# Patient Record
Sex: Female | Born: 1975 | Race: Black or African American | Hispanic: No | Marital: Single | Smoking: Never smoker
Health system: Southern US, Community
[De-identification: ages and names within clinical notes are randomized; demographics above are authoritative.]

## PROBLEM LIST (undated history)

## (undated) DIAGNOSIS — I10 Essential (primary) hypertension: Secondary | ICD-10-CM

## (undated) DIAGNOSIS — G43909 Migraine, unspecified, not intractable, without status migrainosus: Secondary | ICD-10-CM

## (undated) HISTORY — DX: Essential (primary) hypertension: I10

## (undated) HISTORY — PX: OTHER SURGICAL HISTORY: SHX169

---

## 2011-07-21 ENCOUNTER — Emergency Department (INDEPENDENT_AMBULATORY_CARE_PROVIDER_SITE_OTHER): Payer: Managed Care, Other (non HMO)

## 2011-07-21 ENCOUNTER — Encounter (HOSPITAL_BASED_OUTPATIENT_CLINIC_OR_DEPARTMENT_OTHER): Payer: Self-pay

## 2011-07-21 ENCOUNTER — Other Ambulatory Visit: Payer: Self-pay

## 2011-07-21 ENCOUNTER — Emergency Department (HOSPITAL_BASED_OUTPATIENT_CLINIC_OR_DEPARTMENT_OTHER)
Admission: EM | Admit: 2011-07-21 | Discharge: 2011-07-21 | Disposition: A | Discharge status: Home / Self Care | Payer: Managed Care, Other (non HMO) | Attending: Emergency Medicine | Admitting: Emergency Medicine

## 2011-07-21 DIAGNOSIS — R0789 Other chest pain: Secondary | ICD-10-CM

## 2011-07-21 DIAGNOSIS — E049 Nontoxic goiter, unspecified: Secondary | ICD-10-CM

## 2011-07-21 DIAGNOSIS — R799 Abnormal finding of blood chemistry, unspecified: Secondary | ICD-10-CM

## 2011-07-21 DIAGNOSIS — R079 Chest pain, unspecified: Secondary | ICD-10-CM | POA: Insufficient documentation

## 2011-07-21 DIAGNOSIS — R071 Chest pain on breathing: Secondary | ICD-10-CM | POA: Insufficient documentation

## 2011-07-21 DIAGNOSIS — R0602 Shortness of breath: Secondary | ICD-10-CM | POA: Insufficient documentation

## 2011-07-21 MED ORDER — METHOCARBAMOL 500 MG PO TABS: 500.0000 mg | ORAL_TABLET | Freq: Two times a day (BID) | ORAL | Status: AC

## 2011-07-21 MED ORDER — KETOROLAC TROMETHAMINE 30 MG/ML IJ SOLN
30.0000 mg | Freq: Once | INTRAMUSCULAR | Status: AC
  Administered 2011-07-21: 30 mg via INTRAVENOUS
  Filled 2011-07-21: qty 1

## 2011-07-21 MED ORDER — METHOCARBAMOL 500 MG PO TABS: 500.0000 mg | ORAL_TABLET | Freq: Two times a day (BID) | ORAL | Status: DC

## 2011-07-21 MED ORDER — NAPROXEN 500 MG PO TABS: 500.0000 mg | ORAL_TABLET | Freq: Two times a day (BID) | ORAL | Status: AC

## 2011-07-21 MED ORDER — IBUPROFEN 600 MG PO TABS
600.0000 mg | ORAL_TABLET | Freq: Four times a day (QID) | ORAL | Status: DC | PRN
Start: 2011-07-21 — End: 2011-07-21

## 2011-07-21 MED ORDER — IOHEXOL 350 MG/ML SOLN
80.0000 mL | Freq: Once | INTRAVENOUS | Status: AC | PRN
Start: 2011-07-21 — End: 2011-07-21
  Administered 2011-07-21: 80 mL via INTRAVENOUS

## 2011-07-21 NOTE — ED Notes (Signed)
Pain level now 5/10

## 2011-07-21 NOTE — ED Provider Notes (Signed)
History     CSN: [id number]  Arrival date & time 07/21/11  1559   First MD Initiated Contact with Patient 07/21/11 1618      Chief Complaint  Patient presents with  . Chest Pain  . Shortness of Breath    (Consider location/radiation/quality/duration/timing/severity/associated sxs/prior treatment) Patient is a 36 y.o. female presenting with chest pain and shortness of breath. The history is provided by the patient.  Chest Pain Primary symptoms include shortness of breath.    Shortness of Breath  Associated symptoms include chest pain and shortness of breath.   patient presents with pleuritic chest pain x24 hours. Pain described as sharp in nature localized to the left side of chest. Some cough noted which also makes her pain worse. No fever, vomiting, diarrhea. Patient use over-the-counter cold medications without relief. No prior history of this. Denies any leg pain or swelling at this time. Cough has been nonproductive. No sore throat ear pain. Pain is made better with rest. No associated diaphoresis some dyspnea  History reviewed. No pertinent past medical history.  Past Surgical History  Procedure Date  . Tumor removal from neck     No family history on file.  History  Substance Use Topics  . Smoking status: Never Smoker   . Smokeless tobacco: Not on file  . Alcohol Use: No    OB History    Grav Para Term Preterm Abortions TAB SAB Ect Mult Living                  Review of Systems  Respiratory: Positive for shortness of breath.   Cardiovascular: Positive for chest pain.  All other systems reviewed and are negative.    Allergies  Review of patient's allergies indicates no known allergies.  Home Medications   Current Outpatient Rx  Name Route Sig Dispense Refill  . ACETAMINOPHEN-GUAIFENESIN 500-200 MG/15ML PO LIQD Oral Take 30 mLs by mouth every 4 (four) hours as needed. For congestion    . IBUPROFEN 200 MG PO TABS Oral Take 800 mg by mouth 3 (three)  times daily as needed. For pain      BP 131/83  Pulse 97  Temp(Src) 99 F (37.2 C) (Oral)  Resp 18  Ht 5\' 10"  (1.778 m)  Wt 285 lb (129.275 kg)  BMI 40.89 kg/m2  SpO2 100%  LMP 07/21/2011  Physical Exam  Nursing note and vitals reviewed. Constitutional: She is oriented to person, place, and time. She appears well-developed and well-nourished.  Non-toxic appearance. No distress.  HENT:  Head: Normocephalic and atraumatic.  Eyes: Conjunctivae, EOM and lids are normal. Pupils are equal, round, and reactive to light.  Neck: Normal range of motion. Neck supple. No tracheal deviation present. No mass present.  Cardiovascular: Normal rate, regular rhythm and normal heart sounds.  Exam reveals no gallop.   No murmur heard. Pulmonary/Chest: Effort normal and breath sounds normal. No stridor. No respiratory distress. She has no decreased breath sounds. She has no wheezes. She has no rhonchi. She has no rales. She exhibits tenderness. She exhibits no crepitus and no deformity.    Abdominal: Soft. Normal appearance and bowel sounds are normal. She exhibits no distension. There is no tenderness. There is no rebound and no CVA tenderness.  Musculoskeletal: Normal range of motion. She exhibits no edema and no tenderness.  Neurological: She is alert and oriented to person, place, and time. She has normal strength. No cranial nerve deficit or sensory deficit. GCS eye subscore is 4. GCS  verbal subscore is 5. GCS motor subscore is 6.  Skin: Skin is warm and dry. No abrasion and no rash noted.  Psychiatric: She has a normal mood and affect. Her speech is normal and behavior is normal.    ED Course  Procedures (including critical care time)  Labs Reviewed - No data to display No results found.   No diagnosis found.    MDM   Date: 07/21/2011  Rate: 94  Rhythm: normal sinus rhythm  QRS Axis: normal  Intervals: normal  ST/T Wave abnormalities: normal  Conduction Disutrbances:none   Narrative Interpretation:   Old EKG Reviewed: none available and unchanged    7:34 PM Patient given Toradol and feels better after this. CT of chest without evidence of pulmonary embolism. Patient to be discharged home      Toy Baker, MD 07/21/11 1935

## 2011-07-21 NOTE — ED Notes (Signed)
Pt reports onset of midsternal chest pain and SHOB that started yesterday.

## 2011-10-05 ENCOUNTER — Emergency Department (HOSPITAL_BASED_OUTPATIENT_CLINIC_OR_DEPARTMENT_OTHER)
Admission: EM | Admit: 2011-10-05 | Discharge: 2011-10-05 | Disposition: A | Discharge status: Home / Self Care | Payer: Managed Care, Other (non HMO) | Attending: Emergency Medicine | Admitting: Emergency Medicine

## 2011-10-05 ENCOUNTER — Emergency Department (INDEPENDENT_AMBULATORY_CARE_PROVIDER_SITE_OTHER): Payer: Managed Care, Other (non HMO)

## 2011-10-05 DIAGNOSIS — M542 Cervicalgia: Secondary | ICD-10-CM

## 2011-10-05 DIAGNOSIS — R209 Unspecified disturbances of skin sensation: Secondary | ICD-10-CM | POA: Insufficient documentation

## 2011-10-05 DIAGNOSIS — M7918 Myalgia, other site: Secondary | ICD-10-CM

## 2011-10-05 DIAGNOSIS — IMO0001 Reserved for inherently not codable concepts without codable children: Secondary | ICD-10-CM | POA: Insufficient documentation

## 2011-10-05 DIAGNOSIS — M79609 Pain in unspecified limb: Secondary | ICD-10-CM | POA: Insufficient documentation

## 2011-10-05 DIAGNOSIS — M25519 Pain in unspecified shoulder: Secondary | ICD-10-CM | POA: Insufficient documentation

## 2011-10-05 MED ORDER — CYCLOBENZAPRINE HCL 10 MG PO TABS
10.0000 mg | ORAL_TABLET | Freq: Three times a day (TID) | ORAL | Status: AC | PRN
Start: 2011-10-05 — End: 2011-10-15

## 2011-10-05 MED ORDER — PREDNISONE 20 MG PO TABS: ORAL_TABLET | ORAL | Status: DC

## 2011-10-05 MED ORDER — KETOROLAC TROMETHAMINE 60 MG/2ML IM SOLN
60.0000 mg | Freq: Once | INTRAMUSCULAR | Status: AC
  Administered 2011-10-05: 60 mg via INTRAMUSCULAR
  Filled 2011-10-05: qty 2

## 2011-10-05 NOTE — ED Provider Notes (Signed)
History     CSN: [id number]  Arrival date & time 10/05/11  0005   First MD Initiated Contact with Patient 10/05/11 0130      Chief Complaint  Patient presents with  . Shoulder Pain    started a month ago.  Pt. reports seeing and ortho at [facility] with pain meds and antiinflamatories given to her.      (Consider location/radiation/quality/duration/timing/severity/associated sxs/prior treatment) HPI  Patient relates she's had pain in her left neck and left shoulder that radiates into her left arm for the past month. She denies any known injury. She states she is right-handed. She states she has pain and sometimes she gets numbness on the dorsum of her left forearm. She states she is to start physical therapy on April 10. She states she was seen 2 or 3 weeks ago and was told she had inflammation and was started on listed medications. She relates her symptoms have not improved. She has noticed that movement of her head especially to the right makes the pain in her neck feel worse. Range of motion of her shoulder does not affect the pain in her shoulder.  PCP Dr. [facility]   No past medical history on file.  Past Surgical History  Procedure Date  . Tumor removal from neck     No family history on file.  History  Substance Use Topics  . Smoking status: Never Smoker   . Smokeless tobacco: Not on file  . 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    Allergies  Review of patient's allergies indicates no known allergies.  Home Medications   Current Outpatient Rx  Name Route Sig Dispense Refill  . CELECOXIB 200 MG PO CAPS Oral Take 200 mg by mouth 2 (two) times daily.    Marland Kitchen HYDROCODONE-ACETAMINOPHEN 5-500 MG PO TABS Oral Take 1 tablet by mouth every 6 (six) hours as needed.    .  METHOCARBAMOL 750 MG PO TABS Oral Take 750 mg by mouth 4 (four) times daily.    . ACETAMINOPHEN-GUAIFENESIN 500-200 MG/15ML PO LIQD Oral Take 30 mLs by mouth every 4 (four) hours as needed. For congestion    . IBUPROFEN 200 MG PO TABS Oral Take 800 mg by mouth 3 (three) times daily as needed. For pain    . NAPROXEN 500 MG PO TABS Oral Take 1 tablet (500 mg total) by mouth 2 (two) times daily. 30 tablet 0    BP 139/97  Pulse 103  Temp(Src) 98.1 F (36.7 C) (Oral)  Resp 18  Ht 5\' 11"  (1.803 m)  Wt 300 lb (136.079 kg)  BMI 41.84 kg/m2  SpO2 100%  LMP 09/18/2011  Vital signs normal tachycardia   Physical Exam  Nursing note and vitals reviewed. Constitutional: She is oriented to person, place, and time. She appears well-developed and well-nourished.  Non-toxic appearance. She does not appear ill. No distress.       Obese  HENT:  Head: Normocephalic and atraumatic.  Right Ear: External ear normal.  Left Ear: External ear normal.  Nose: Nose normal. No mucosal edema or rhinorrhea.  Mouth/Throat: Oropharynx is clear and moist and mucous membranes are normal. No dental abscesses or uvula swelling.  Eyes:  Conjunctivae and EOM are normal. Pupils are equal, round, and reactive to light.  Neck: Normal range of motion and full passive range of motion without pain. Neck supple.       Patient is tender in her left paraspinous muscles and also along her left trapezius which reproduces a lot of her pain.  Cardiovascular: Normal rate, regular rhythm and normal heart sounds.  Exam reveals no gallop and no friction rub.   No murmur heard. Pulmonary/Chest: Effort normal and breath sounds normal. No respiratory distress. She has no wheezes. She has no rhonchi. She has no rales. She exhibits no tenderness and no crepitus.  Abdominal: Soft. Normal appearance and bowel sounds are normal. She exhibits no distension. There is no tenderness. There is no rebound and no guarding.  Musculoskeletal: Normal range  of motion. She exhibits no edema and no tenderness.       Moves all extremities well. She does not have pain when I do range of motion of her shoulder specifically no pain consistent with rotator cuff injury. She has good distal grip.  Lymphadenopathy:    She has no cervical adenopathy.  Neurological: She is alert and oriented to person, place, and time. She has normal strength. No cranial nerve deficit.  Skin: Skin is warm, dry and intact. No rash noted. No erythema. No pallor.  Psychiatric: She has a normal mood and affect. Her speech is normal and behavior is normal. Her mood appears not anxious.    ED Course  Procedures (including critical care time)   Medications  ketorolac (TORADOL) injection 60 mg (60 mg Intramuscular Given 10/05/11 0150)   Pt is driving herself home.   Labs Reviewed - No data to display Dg Cervical Spine Complete  10/05/2011  *RADIOLOGY REPORT*  Clinical Data: Left-sided cervical spine pain for 1 month.  No known injury.  Left upper extremity numbness and tingling.  CERVICAL SPINE - COMPLETE 4+ VIEW  Comparison: None.  Findings: There is reversal of the usual cervical lordosis which may be due to patient positioning but ligamentous injury or muscle spasm can also have this appearance.  No significant displacement of the facet joints.  No bony encroachment upon the neural foramina.  No vertebral compression deformities.  Intervertebral disc space heights are preserved.  No prevertebral soft tissue swelling.  Lateral masses of C1 appear symmetrical.  The odontoid process appears intact.  IMPRESSION: Reversal of the usual cervical lordosis which may be due to patient positioning, ligamentous injury, or muscle spasm.  No displaced fractures identified.  Original Report Authenticated By: Marlon Pel, M.D.     1. Musculoskeletal pain    New Prescriptions   CYCLOBENZAPRINE (FLEXERIL) 10 MG TABLET    Take 1 tablet (10 mg total) by mouth 3 (three) times daily as needed  for muscle spasms.   PREDNISONE (DELTASONE) 20 MG TABLET    Take 3 po QD x 3d , then 2 po QD x 3d then 1 po QD x 3d   Plan discharge Devoria Albe, MD, FACEP    MDM          Ward Givens, MD 10/05/11 [phone]

## 2011-10-05 NOTE — ED Notes (Signed)
Pt. Reports at times she has numbness and tingling in the L arm.  TOnight Pt. Reports pain in L shoulder and L arm

## 2011-10-05 NOTE — ED Notes (Signed)
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 Reports seeing dr. Katrinka Blazing her pcp and md at [facility] couple weeks ago, neither could define cause or pinpoint issue. Per patient xrays at that time were negative. Ice packs, heat , pain patches and pain medications (vicodin and robaxin) have not helped. Last dosage was 1830 [place], she took both medications with no relief,  Not sleeping at home because of pain

## 2011-10-05 NOTE — Discharge Instructions (Signed)
Try ice packs on your sore muscles in the left side of your neck and shoulder. Stop the Celebrex, Advil, Naprosyn and robaxin. Take the prednisone and Flexeril as prescribed. Keep your appointment to start physical therapy on the 10th. Please call [facility] back to let them know the medications you are on is not helping.

## 2013-03-07 IMAGING — CT CT ANGIO CHEST
2 of 7 series · 19 of 36 positions shown · IV contrast (APPLIED)
Comparison: None.

CLINICAL DATA: Chest pain and shortness of breath with elevated D-
dimer.

CT ANGIOGRAPHY CHEST
TECHNIQUE: Multidetector CT imaging of the chest using the
standard protocol during bolus administration of intravenous
contrast. Multiplanar reconstructed images including MIPs were
obtained and reviewed to evaluate the vascular anatomy.
Contrast: 80mL OMNIPAQUE IOHEXOL 350 MG/ML IV SOLN

[Series 5: pe 1.0 b25f · axial · 0.58mm/px · z∈[-234,-46]mm · 18 of 209 slices shown]
[im 11/209  lung]
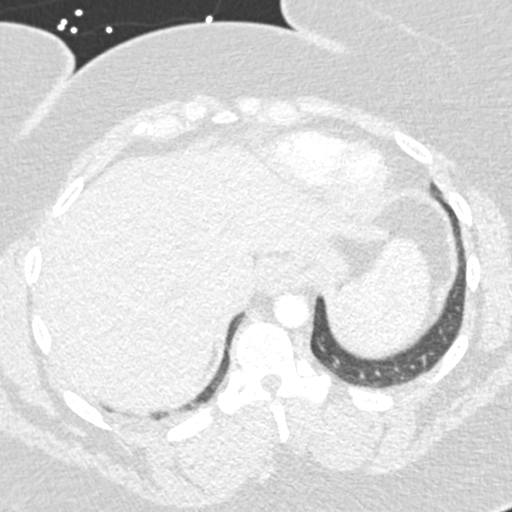
[im 21/209  mediastinal]
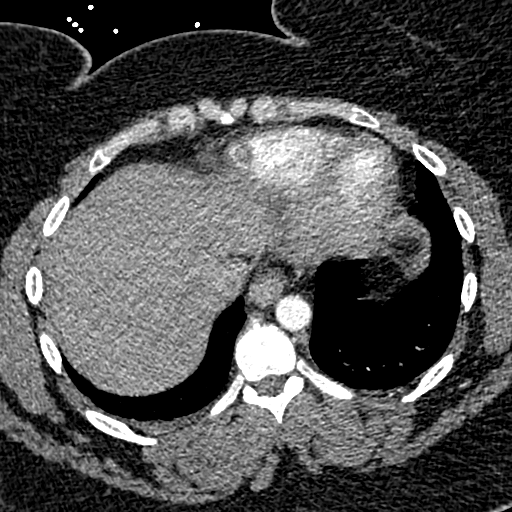
[im 32/209  lung]
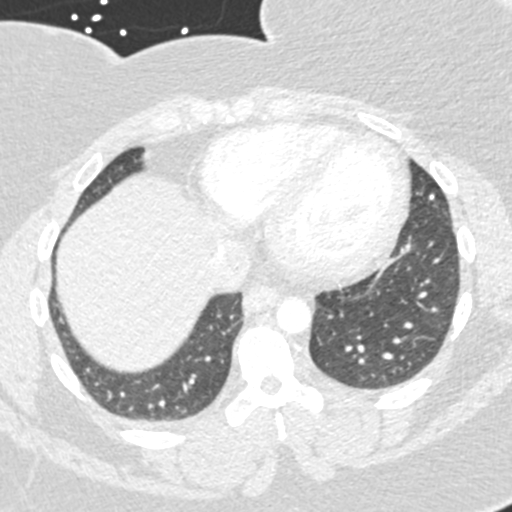
[im 42/209  mediastinal]
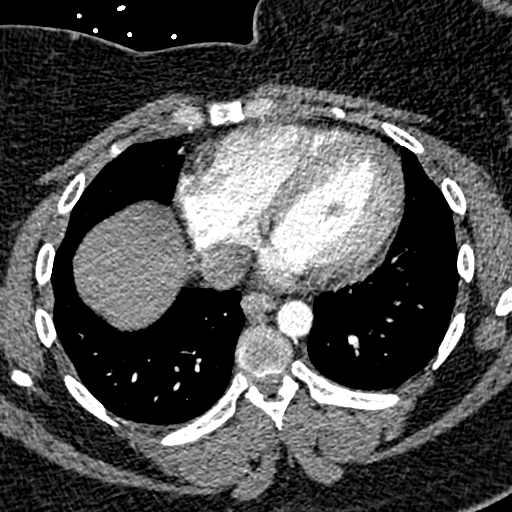
[im 53/209  lung]
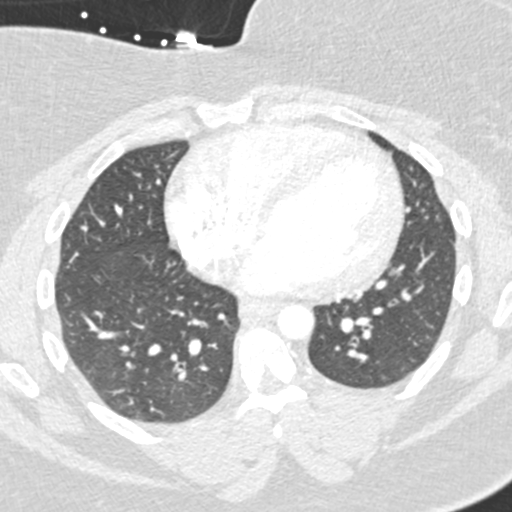
[im 63/209  mediastinal]
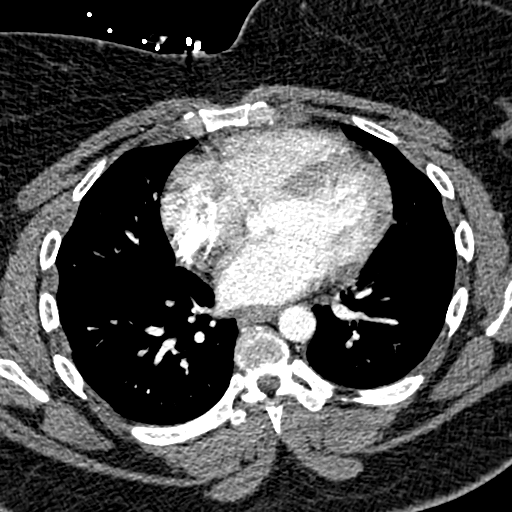
[im 73/209  lung]
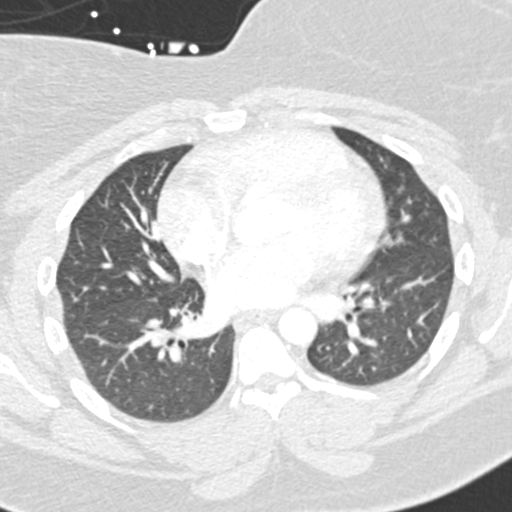
[im 84/209  mediastinal]
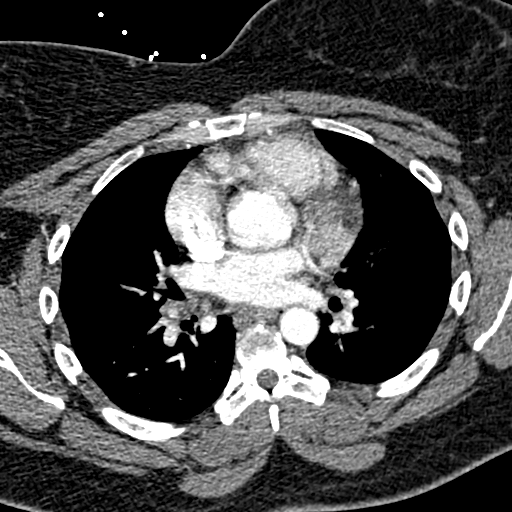
[im 94/209  lung]
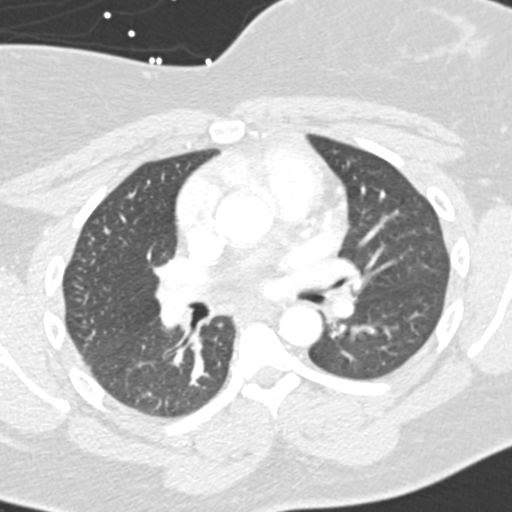
[im 115/209  mediastinal]
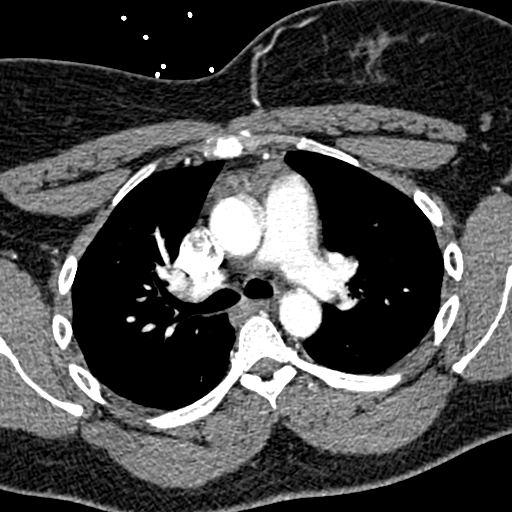
[im 125/209  lung]
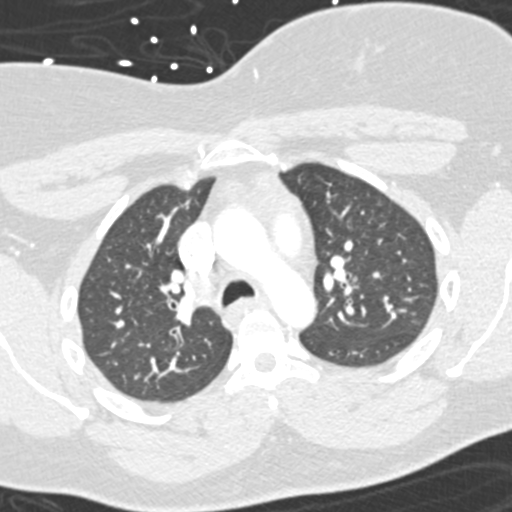
[im 136/209  mediastinal]
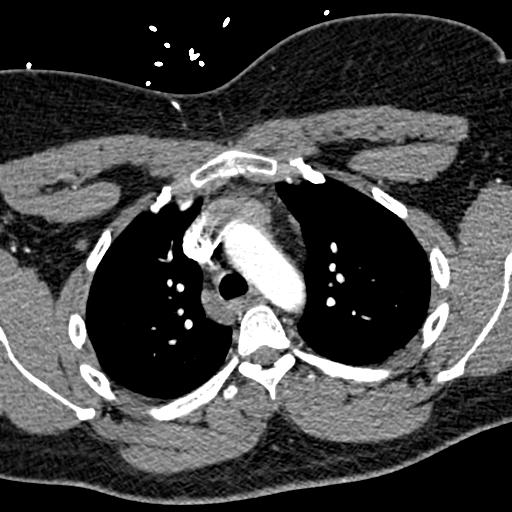
[im 146/209  lung]
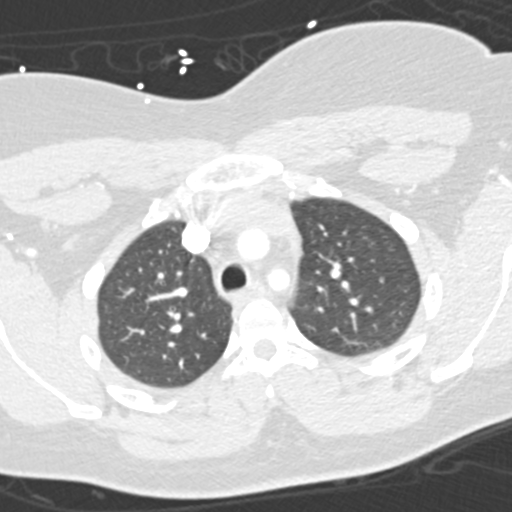
[im 157/209  mediastinal]
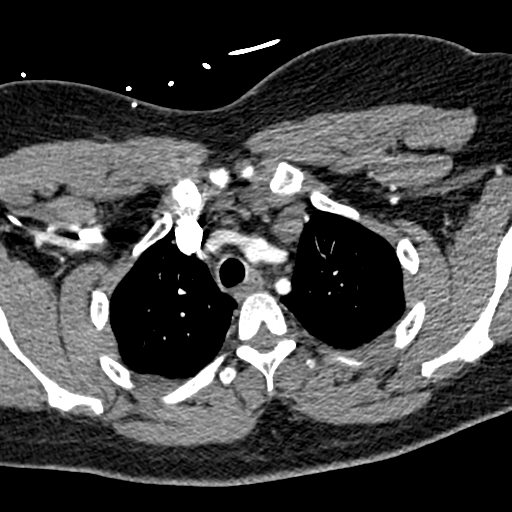
[im 167/209  lung]
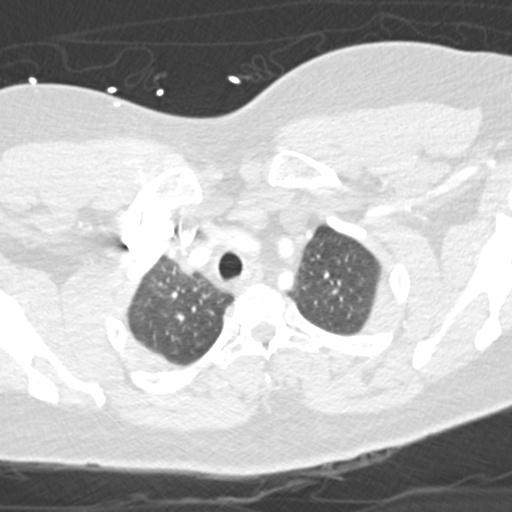
[im 177/209  mediastinal]
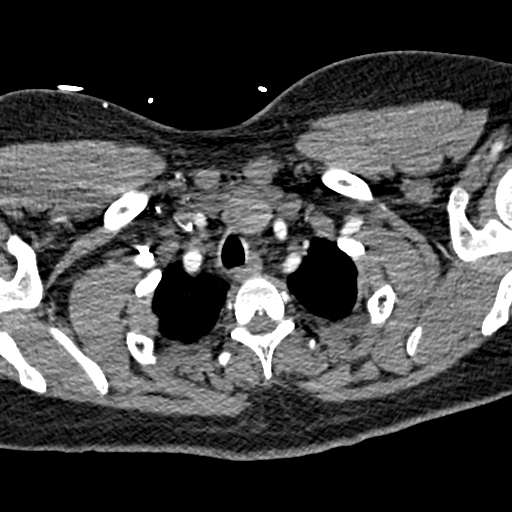
[im 188/209  lung]
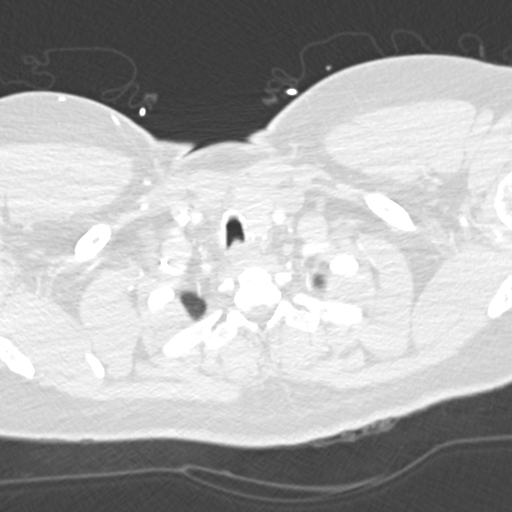
[im 198/209  mediastinal]
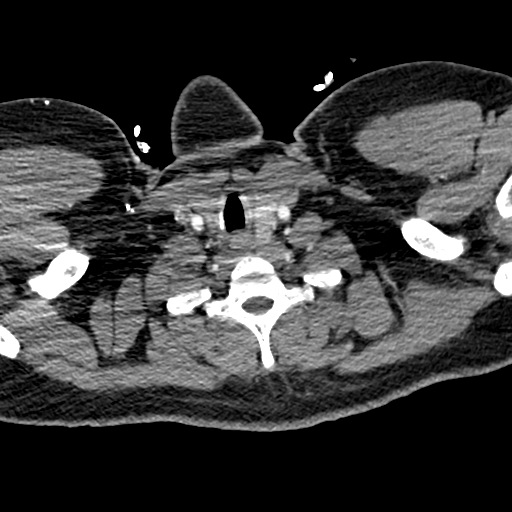

[Series 8: pe 2.0 coronal · coronal · 0.43mm/px · 1 of 107 slices shown]
[im 54/107  mediastinal]
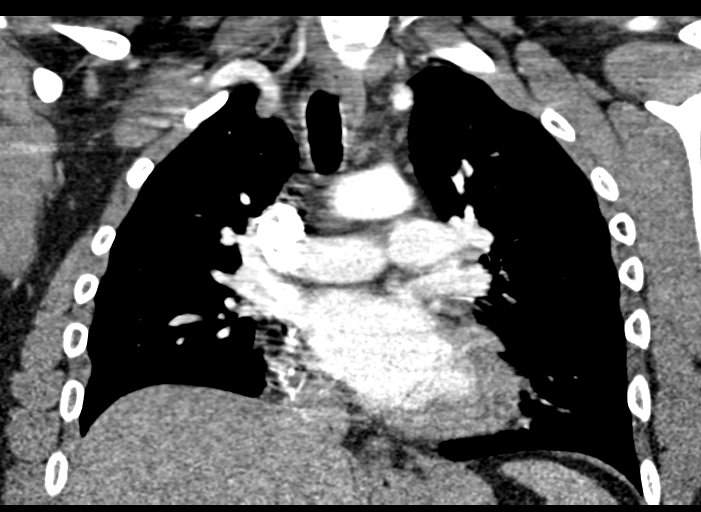

[19 of 36 positions shown; findings below may reference images not displayed]

FINDINGS: No pulmonary embolus.  Right lobe of the thyroid appears
surgically absent.  There appears to be nodular enlargement of the
left lobe of the thyroid.  Possible residual thymus in the
prevascular space.  Heart is enlarged.  No pericardial effusion.

Lungs are clear.  No pleural fluid.  Airway is unremarkable.

Incidental imaging of the upper abdomen shows a small retrocrural
lymph node.  No worrisome lytic or sclerotic lesions.
IMPRESSION: 1.  No pulmonary embolus.  No findings to explain the patient's
given symptoms.
2.  Nodular-appearing enlargement of the left lobe of the thyroid.
Please correlate clinically.

## 2015-06-28 ENCOUNTER — Encounter (HOSPITAL_BASED_OUTPATIENT_CLINIC_OR_DEPARTMENT_OTHER): Payer: Self-pay | Admitting: *Deleted

## 2015-06-28 ENCOUNTER — Emergency Department (HOSPITAL_BASED_OUTPATIENT_CLINIC_OR_DEPARTMENT_OTHER)
Admission: EM | Admit: 2015-06-28 | Discharge: 2015-06-28 | Disposition: A | Discharge status: Home / Self Care | Payer: BLUE CROSS/BLUE SHIELD | Attending: Emergency Medicine | Admitting: Emergency Medicine

## 2015-06-28 DIAGNOSIS — Z79899 Other long term (current) drug therapy: Secondary | ICD-10-CM | POA: Diagnosis not present

## 2015-06-28 DIAGNOSIS — E669 Obesity, unspecified: Secondary | ICD-10-CM | POA: Diagnosis not present

## 2015-06-28 DIAGNOSIS — Z791 Long term (current) use of non-steroidal anti-inflammatories (NSAID): Secondary | ICD-10-CM | POA: Diagnosis not present

## 2015-06-28 DIAGNOSIS — G43009 Migraine without aura, not intractable, without status migrainosus: Secondary | ICD-10-CM | POA: Diagnosis not present

## 2015-06-28 DIAGNOSIS — G43909 Migraine, unspecified, not intractable, without status migrainosus: Secondary | ICD-10-CM | POA: Diagnosis present

## 2015-06-28 HISTORY — DX: Migraine, unspecified, not intractable, without status migrainosus: G43.909

## 2015-06-28 MED ORDER — PROMETHAZINE HCL 25 MG/ML IJ SOLN
25.0000 mg | Freq: Once | INTRAMUSCULAR | Status: AC
  Administered 2015-06-28: 25 mg via INTRAMUSCULAR
  Filled 2015-06-28: qty 1

## 2015-06-28 MED ORDER — KETOROLAC TROMETHAMINE 60 MG/2ML IM SOLN
60.0000 mg | Freq: Once | INTRAMUSCULAR | Status: AC
  Administered 2015-06-28: 60 mg via INTRAMUSCULAR
  Filled 2015-06-28: qty 2

## 2015-06-28 MED ORDER — PROCHLORPERAZINE EDISYLATE 5 MG/ML IJ SOLN
10.0000 mg | Freq: Once | INTRAMUSCULAR | Status: AC | PRN
  Administered 2015-06-28: 10 mg via INTRAVENOUS
  Filled 2015-06-28: qty 2

## 2015-06-28 MED ORDER — SODIUM CHLORIDE 0.9 % IV BOLUS (SEPSIS)
1000.0000 mL | Freq: Once | INTRAVENOUS | Status: AC
  Administered 2015-06-28: 1000 mL via INTRAVENOUS

## 2015-06-28 MED ORDER — DIPHENHYDRAMINE HCL 50 MG/ML IJ SOLN
25.0000 mg | Freq: Once | INTRAMUSCULAR | Status: AC
Start: 2015-06-28 — End: 2015-06-28
  Administered 2015-06-28: 25 mg via INTRAVENOUS
  Filled 2015-06-28: qty 1

## 2015-06-28 NOTE — ED Provider Notes (Signed)
CSN: [id number]     Arrival date & time 06/28/15  1027 History   First MD Initiated Contact with Patient 06/28/15 [phone]     Chief Complaint  Patient presents with  . Migraine     (Consider location/radiation/quality/duration/timing/severity/associated sxs/prior Treatment) HPI  This is a 39 year old female with a history of migraines who presents with headache. Onset of headache on Friday. She reports it is throbbing. Currently 10 out of 10. She has taken her home Topamax and Relpax but has had no relief. Reports nausea without vomiting. Reports light sensitivity. It is located on the right side and is typical for her migraines. Denies worst headache of her life. Frequently has to get urgent care for treatment. Usually gets Toradol and Phenergan. Denies any fevers or neck pain.  Past Medical History  Diagnosis Date  . Migraines    Past Surgical History  Procedure Laterality Date  . Tumor removal from neck     No family history on file. Social History  Substance Use Topics  . Smoking status: Never Smoker   . Smokeless tobacco: None  . Alcohol Use: Yes     Comment: occasional    OB History    No data available     Review of Systems  Constitutional: Negative for fever.  Eyes: Positive for photophobia.  Respiratory: Negative for chest tightness and shortness of breath.   Cardiovascular: Negative for chest pain.  Gastrointestinal: Positive for nausea. Negative for vomiting.  Genitourinary: Negative for dysuria.  Musculoskeletal: Negative for neck pain and neck stiffness.  Neurological: Positive for headaches.  All other systems reviewed and are negative.     Allergies  Codeine  Home Medications   Prior to Admission medications   Medication Sig Start Date End Date Taking? Authorizing Provider  eletriptan (RELPAX) 40 MG tablet Take 40 mg by mouth as needed for migraine or headache. May repeat in 2 hours if headache persists or recurs.   Yes Historical Provider, MD   topiramate (TOPAMAX) 50 MG tablet Take 50 mg by mouth 2 (two) times daily.   Yes Historical Provider, MD  Acetaminophen-Guaifenesin (TYLENOL CHEST CONGESTION) 500-200 MG/15ML LIQD Take 30 mLs by mouth every 4 (four) hours as needed. For congestion    Historical Provider, MD  celecoxib (CELEBREX) 200 MG capsule Take 200 mg by mouth 2 (two) times daily.    Historical Provider, MD  HYDROcodone-acetaminophen (VICODIN) 5-500 MG per tablet Take 1 tablet by mouth every 6 (six) hours as needed.    Historical Provider, MD  ibuprofen (ADVIL,MOTRIN) 200 MG tablet Take 800 mg by mouth 3 (three) times daily as needed. For pain    Historical Provider, MD  methocarbamol (ROBAXIN) 750 MG tablet Take 750 mg by mouth 4 (four) times daily.    Historical Provider, MD  predniSONE (DELTASONE) 20 MG tablet Take 3 po QD x 3d , then 2 po QD x 3d then 1 po QD x 3d 10/05/11   Devoria Albe, MD   BP 116/84 mmHg  Pulse 84  Temp(Src) 98.2 F (36.8 C) (Oral)  Resp 20  Ht  (1.778 m)  Wt 271 lb 3 oz (123.01 kg)  BMI 38.91 kg/m2  SpO2 98%  LMP 06/02/2015 (Approximate) Physical Exam  Constitutional: She is oriented to person, place, and time. She appears well-developed and well-nourished.  Obese  HENT:  Head: Normocephalic and atraumatic.  Eyes: Pupils are equal, round, and reactive to light.  Neck: Normal range of motion.  Cardiovascular: Normal rate and regular  rhythm.   Pulmonary/Chest: Effort normal. No respiratory distress.  Musculoskeletal: She exhibits no edema.  Neurological: She is alert and oriented to person, place, and time.  Cranial nerves II-12 intact, fluent speech, normal gait  Skin: Skin is warm and dry.  Psychiatric: She has a normal mood and affect.  Nursing note and vitals reviewed.   ED Course  Procedures (including critical care time) Labs Review Labs Reviewed - No data to display  Imaging Review No results found. I have personally reviewed and evaluated these images and lab results as  part of my medical decision-making.   EKG Interpretation None      MDM   Final diagnoses:  Migraine without aura and without status migrainosus, not intractable    Patient presents with headache consistent with her migraines. Nontoxic on exam. Low suspicion for other causes of headache including subarachnoid hemorrhage or meningitis. Patient initially given Toradol and Phenergan. On recheck, reports persistent headache. IV was established and patient was given Compazine, Benadryl, and fluids.  6:20 AM On recheck, patient is more comfortable. She is requesting discharge to go sleep in her bed.  After history, exam, and medical workup I feel the patient has been appropriately medically screened and is safe for discharge home. Pertinent diagnoses were discussed with the patient. Patient was given return precautions.   Shon Batonourtney F Laryssa Hassing, MD 06/28/15 [phone]

## 2015-06-28 NOTE — ED Notes (Signed)
Pt. States hx of migraines and states she has had a h/a since Friday. States pain is located on the right side. States when she lays her head back her entire head will hurt. C/o nausea. Denies vomiting. C/o light sensitive. States this is typical of her migraines.

## 2015-06-28 NOTE — Discharge Instructions (Signed)

## 2015-06-28 NOTE — ED Notes (Signed)
MD with pt  

## 2015-09-04 ENCOUNTER — Encounter: Payer: Self-pay | Admitting: Neurology

## 2015-09-04 ENCOUNTER — Ambulatory Visit (INDEPENDENT_AMBULATORY_CARE_PROVIDER_SITE_OTHER): Payer: BLUE CROSS/BLUE SHIELD | Admitting: Neurology

## 2015-09-04 VITALS — BP 128/84 | HR 80 | Ht 70.0 in | Wt 277.1 lb

## 2015-09-04 DIAGNOSIS — N943 Premenstrual tension syndrome: Secondary | ICD-10-CM | POA: Diagnosis not present

## 2015-09-04 DIAGNOSIS — G43829 Menstrual migraine, not intractable, without status migrainosus: Secondary | ICD-10-CM | POA: Diagnosis not present

## 2015-09-04 MED ORDER — PROPRANOLOL HCL ER 60 MG PO CP24: 60.0000 mg | ORAL_CAPSULE | Freq: Every day | ORAL | Status: AC

## 2015-09-04 MED ORDER — ZOLMITRIPTAN 5 MG NA SOLN: 1.0000 | NASAL | Status: AC | PRN

## 2015-09-04 NOTE — Addendum Note (Signed)
Addended by: Vivien RotaRIVER, Iokepa Geffre H on: 8/2/95623/09/2015 03:41 PM   Modules accepted: Orders

## 2015-09-04 NOTE — Progress Notes (Signed)
NEUROLOGY CONSULTATION NOTE  Jenna Garcia MRN: [id number] DOB: 1975/09/27  Referring provider: Dr. Katrinka BlazingSmith Primary care provider: Dr. Katrinka BlazingSmith  Reason for consult:  migraine  HISTORY OF PRESENT ILLNESS: Jenna Garcia is a 40 year old right-handed female hyperlipidemia, pre-diabetes, GERD who presents for migraine.  History obtained by patient, PCP note and her boyfriend.  Onset:  2000 Location:  Unilateral, either side Quality:  pounding Intensity:  10/10 Aura:  Zigzag lines and spots Prodrome:  no Associated symptoms:  Photophobia, phonophobia, osmophobia, sometimes nausea and vomiting Duration:  1 day Frequency:  Occurs about 15 days per month during 3 week period from one week prior to period up to end of week after her period Triggers/exacerbating factors:  Period, stress Relieving factors:  sleep Activity:  Cannot function 5 days per month  CT of head approximately 2 years ago was reportedly unremarkable.  Past NSAIDS:  Advil, Aleve Past analgesics:  Tylenol Past abortive triptans:  Sumatriptan po Past antihypertensive medications:  none Past antidepressant medications:  none Past anticonvulsant medications:  none Past vitamins/Herbal/Supplements:  Magnesium oxide 250mg  (for Mg replacement)  Current NSAIDS:  Excedrin Migraine Current analgesics:  none Current triptans:  Relpax 40mg  Current anti-nausea:  none Current muscle relaxants:  none Current Antihypertensive medications:  none Current Antidepressant medications:  none Current Anticonvulsant medications:  topiramate 50mg  twice daily (higher doses caused side effects) Current Vitamins/Herbal/Supplements:  vitamin D Current Antihistamines/Decongestants:  none  Caffeine:  Green tea Alcohol:  occasionally Smoker:  no Diet:  Improving.  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 Migraines     PAST SURGICAL HISTORY: Past Surgical History  Procedure Laterality Date  . 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 eletriptan (RELPAX) 40 MG tablet Take 40 mg by mouth as needed for migraine or headache. May repeat in 2 hours if headache persists or recurs.    . topiramate (TOPAMAX) 50 MG tablet Take 50 mg by mouth 2 (two) times daily.     No current facility-administered medications on file prior to visit.    ALLERGIES: Allergies  Allergen Reactions  . Codeine     Thinks she is allergic to codeine     FAMILY HISTORY: Family History  Problem Relation Age of Onset  . Diabetes Maternal Aunt   . Hypertension Mother   . Heart disease Maternal Uncle   . Epilepsy Brother     SOCIAL HISTORY: Social History   Social History  . Marital Status: Single    Spouse Name: N/A  . Number of Children: N/A  . Years of Education: N/A   Occupational History  . Not on file.   Social History Main Topics  . Smoking status: Never Smoker   . Smokeless tobacco: Never Used  . Alcohol Use: 0.0 oz/week    0 Standard drinks or equivalent per week     Comment: occasional   . Drug Use: No  . Sexual Activity: Not on file   Other Topics Concern  . Not on file   Social History Narrative   Lives alone in a one story home.  Has one daughter in college.  Works for [employer].  Education: high school.    REVIEW OF SYSTEMS: Constitutional: No  fevers, chills, or sweats, no generalized fatigue, change in appetite Eyes: No visual changes, double vision, eye pain Ear, nose and throat: No hearing loss, ear pain, nasal congestion, sore throat Cardiovascular: No chest pain, palpitations Respiratory:  No shortness of breath at rest or with exertion, wheezes GastrointestinaI: No nausea, vomiting, diarrhea, abdominal pain, fecal  incontinence Genitourinary:  No dysuria, urinary retention or frequency Musculoskeletal:  No neck pain, back pain Integumentary: No rash, pruritus, skin lesions Neurological: as above Psychiatric: No depression, insomnia, anxiety Endocrine: No palpitations, fatigue, diaphoresis, mood swings, change in appetite, change in weight, increased thirst Hematologic/Lymphatic:  No anemia, purpura, petechiae. Allergic/Immunologic: no itchy/runny eyes, nasal congestion, recent allergic reactions, rashes  PHYSICAL EXAM: Filed Vitals:   09/04/15 1420  BP: 128/84  Pulse: 80   General: No acute distress.  Patient appears well-groomed.  Head:  Normocephalic/atraumatic Eyes:  fundi unremarkable, without vessel changes, exudates, hemorrhages or papilledema. Neck: supple, no paraspinal tenderness, full range of motion Back: No paraspinal tenderness Heart: regular rate and rhythm Lungs: Clear to auscultation bilaterally. Vascular: No carotid bruits. Neurological Exam: Mental status: alert and oriented to person, place, and time, recent and remote memory intact, fund of knowledge intact, attention and concentration intact, speech fluent and not dysarthric, language intact. Cranial nerves: CN I: not tested CN II: pupils equal, round and reactive to light, visual fields intact, fundi unremarkable, without vessel changes, exudates, hemorrhages or papilledema. CN III, IV, VI:  full range of motion, no nystagmus, no ptosis CN V: facial sensation intact CN VII: upper and lower face symmetric CN VIII: hearing intact CN IX, X: gag intact, uvula midline CN XI: sternocleidomastoid and trapezius muscles intact CN XII: tongue midline Bulk & Tone: normal, no fasciculations. Motor:  5/5 throughout  Sensation:  Temperature and vibration sensation intact. Deep Tendon Reflexes:  1+ throughout, toes downgoing.  Finger to nose testing:  Without dysmetria.  Heel to shin:  Without dysmetria.  Gait:  Normal station  and stride.  Able to turn and tandem walk. Romberg negative.  IMPRESSION: Menstrual migraine  PLAN: 1.  Taper off topiramate and start propranolol ER  daily.  She is to call in 4 weeks with update 2.  Stop Relpax and try Zomig  NS 3.  Lifestyle modification 4.  Follow up in 3 to 4 months.  45 minutes spent face to face with patient, over 50% spent discussing management.  Thank you for allowing me to take part in the care of this patient.  Shon Millet, DO  CC:  Caffie Damme, MD

## 2015-09-04 NOTE — Addendum Note (Signed)
Addended by: Vivien RotaRIVER, Lakeith Careaga H on: 1/6/10963/09/2015 03:42 PM   Modules accepted: Orders

## 2015-09-04 NOTE — Progress Notes (Signed)
Note routed

## 2015-09-04 NOTE — Patient Instructions (Signed)
Migraine Recommendations: 1.  Start propranolol ER 60mg  daily.  Call in 4 weeks with update and we can adjust dose if needed. 2.  Take Zomig 5mg  nasal spray.  Spray once at earliest onset of headache.  May repeat dose once in 2 hours if needed.  Do not exceed two sprays in 24 hours. 3.  Limit use of pain relievers to no more than 2 days out of the week.  These medications include acetaminophen, ibuprofen, triptans and narcotics.  This will help reduce risk of rebound headaches. 4.  Be aware of common food triggers such as processed sweets, processed foods with nitrites (such as deli meat, hot dogs, sausages), foods with MSG, alcohol (such as wine), chocolate, certain cheeses, certain fruits (dried fruits, some citrus fruit), vinegar, diet soda. 4.  Avoid caffeine 5.  Routine exercise 6.  Proper sleep hygiene 7.  Stay adequately hydrated with water 8.  Keep a headache diary. 9.  Maintain proper stress management. 10.  Do not skip meals. 11.  Consider supplements:  Magnesium oxide 400mg  to 600mg  daily, riboflavin 400mg , Coenzyme Q 10 100mg  three times daily 12.  Decrease topiramate to 50mg  daily for 7 days, then stop 13.  CALL IN 4 WEEKS WITH UPDATE.

## 2015-12-18 ENCOUNTER — Ambulatory Visit: Payer: BLUE CROSS/BLUE SHIELD | Admitting: Neurology

## 2016-03-25 ENCOUNTER — Ambulatory Visit: Payer: BLUE CROSS/BLUE SHIELD | Admitting: Neurology

## 2016-03-25 DIAGNOSIS — Z029 Encounter for administrative examinations, unspecified: Secondary | ICD-10-CM

## 2021-04-21 ENCOUNTER — Encounter: Payer: Self-pay | Admitting: General Practice

## 2022-05-25 ENCOUNTER — Encounter (HOSPITAL_BASED_OUTPATIENT_CLINIC_OR_DEPARTMENT_OTHER): Payer: Self-pay

## 2022-05-25 ENCOUNTER — Emergency Department (HOSPITAL_BASED_OUTPATIENT_CLINIC_OR_DEPARTMENT_OTHER): Payer: 59

## 2022-05-25 ENCOUNTER — Other Ambulatory Visit: Payer: Self-pay

## 2022-05-25 ENCOUNTER — Emergency Department (HOSPITAL_BASED_OUTPATIENT_CLINIC_OR_DEPARTMENT_OTHER)
Admission: EM | Admit: 2022-05-25 | Discharge: 2022-05-25 | Disposition: A | Discharge status: Home / Self Care | Payer: 59 | Attending: Emergency Medicine | Admitting: Emergency Medicine

## 2022-05-25 DIAGNOSIS — Z79899 Other long term (current) drug therapy: Secondary | ICD-10-CM | POA: Diagnosis not present

## 2022-05-25 DIAGNOSIS — R079 Chest pain, unspecified: Secondary | ICD-10-CM | POA: Diagnosis present

## 2022-05-25 DIAGNOSIS — R5383 Other fatigue: Secondary | ICD-10-CM | POA: Diagnosis not present

## 2022-05-25 DIAGNOSIS — I10 Essential (primary) hypertension: Secondary | ICD-10-CM | POA: Diagnosis not present

## 2022-05-25 LAB — CBC
HCT: 38.1 % (ref 36.0–46.0)
Hemoglobin: 12.7 g/dL (ref 12.0–15.0)
MCH: 29.4 pg (ref 26.0–34.0)
MCHC: 33.3 g/dL (ref 30.0–36.0)
MCV: 88.2 fL (ref 80.0–100.0)
Platelets: 242 10*3/uL (ref 150–400)
RBC: 4.32 MIL/uL (ref 3.87–5.11)
RDW: 12.2 % (ref 11.5–15.5)
WBC: 5.9 10*3/uL (ref 4.0–10.5)
nRBC: 0 % (ref 0.0–0.2)

## 2022-05-25 LAB — BASIC METABOLIC PANEL
Anion gap: 8 (ref 5–15)
BUN: 10 mg/dL (ref 6–20)
CO2: 25 mmol/L (ref 22–32)
Calcium: 8.2 mg/dL — ABNORMAL LOW (ref 8.9–10.3)
Chloride: 101 mmol/L (ref 98–111)
Creatinine, Ser: 0.87 mg/dL (ref 0.44–1.00)
GFR, Estimated: >60 mL/min (ref >60)
Glucose, Bld: 103 mg/dL — ABNORMAL HIGH (ref 70–99)
Potassium: 3.3 mmol/L — ABNORMAL LOW (ref 3.5–5.1)
Sodium: 134 mmol/L — ABNORMAL LOW (ref 135–145)

## 2022-05-25 LAB — TROPONIN I (HIGH SENSITIVITY)
Troponin I (High Sensitivity): <2 ng/L (ref <18)
Troponin I (High Sensitivity): <2 ng/L (ref <18)

## 2022-05-25 LAB — PREGNANCY, URINE: Preg Test, Ur: NEGATIVE

## 2022-05-25 MED ORDER — POTASSIUM CHLORIDE CRYS ER 20 MEQ PO TBCR
20.0000 meq | EXTENDED_RELEASE_TABLET | Freq: Once | ORAL | Status: AC
  Administered 2022-05-25: 20 meq via ORAL
  Filled 2022-05-25: qty 1

## 2022-05-25 NOTE — ED Provider Notes (Signed)
[facility] EMERGENCY DEPARTMENT Provider Note   CSN: [id number] Arrival date & time: 05/25/22  0037     History  Chief Complaint  Patient presents with   Chest Pain    Jenna Garcia is a 46 y.o. female.  The history is provided by the patient.  Chest Pain Jenna Garcia is a 46 y.o. female who presents to the Emergency Department complaining of chest pain.  She presents to the ED for evaluation of left upper chest pain that started this evening.  Pain is sharp and radiates to the left arm.  Has been feeling fatigued today.    No fever, cough, sob.  No N/V, abdominal pain, leg swelling/pain.    Scheduled for echo on 11/27 for enlarged heart on CXR. Started on meds for HTN one week ago.   Took Ozempic on Monday.    No hormone use.  No hx/o DVT/PE.         Home Medications Prior to Admission medications   Medication Sig Start Date End Date Taking? Authorizing Provider  olmesartan (BENICAR) 20 MG tablet Take 20 mg by mouth daily.   Yes [provider]  eletriptan (RELPAX) 40 MG tablet Take 40 mg by mouth as needed for migraine or headache. May repeat in 2 hours if headache persists or recurs.    [provider]  propranolol ER (INDERAL LA) 60 MG 24 hr capsule Take 1 capsule (60 mg total) by mouth daily. 09/04/15   Everlena Cooper, Adam R, DO  topiramate (TOPAMAX) 50 MG tablet Take 50 mg by mouth 2 (two) times daily.    [provider]  zolmitriptan (ZOMIG) 5 MG nasal solution Place 1 spray into the nose as needed for migraine. 09/04/15   Drema Dallas, DO      Allergies    Codeine    Review of Systems   Review of Systems  Cardiovascular:  Positive for chest pain.  All other systems reviewed and are negative.   Physical Exam Updated Vital Signs BP (!) 125/91   Pulse 77   Temp 98.1 F (36.7 C) (Oral)   Resp 18   Ht 5\' 10"  (1.778 m)   Wt 127 kg   LMP 05/19/2022 (Exact Date)   SpO2 97%   BMI 40.18 kg/m  Physical Exam Vitals and nursing  note reviewed.  Constitutional:      Appearance: She is well-developed.  HENT:     Head: Normocephalic and atraumatic.  Cardiovascular:     Rate and Rhythm: Normal rate and regular rhythm.     Heart sounds: No murmur heard. Pulmonary:     Effort: Pulmonary effort is normal. No respiratory distress.     Breath sounds: Normal breath sounds.  Abdominal:     Palpations: Abdomen is soft.     Tenderness: There is no abdominal tenderness. There is no guarding or rebound.  Musculoskeletal:        General: No swelling or tenderness.     Comments: 2+ radial pulses bilaterally  Skin:    General: Skin is warm and dry.  Neurological:     Mental Status: She is alert and oriented to person, place, and time.  Psychiatric:        Behavior: Behavior normal.     ED Results / Procedures / Treatments   Labs (all labs ordered are listed, but only abnormal results are displayed) Labs Reviewed  BASIC METABOLIC PANEL - Abnormal; Notable for the following components:      Result  Value   Sodium 134 (*)    Potassium 3.3 (*)    Glucose, Bld 103 (*)    Calcium 8.2 (*)    All other components within normal limits  CBC  PREGNANCY, URINE  TROPONIN I (HIGH SENSITIVITY)  TROPONIN I (HIGH SENSITIVITY)    EKG EKG Interpretation  Date/Time:  Wednesday May 25 2022 00:46:09 EST Ventricular Rate:  78 PR Interval:  182 QRS Duration: 90 QT Interval:  358 QTC Calculation: 408 R Axis:   78 Text Interpretation: Normal sinus rhythm Normal ECG Confirmed by Tilden Fossa [id number]) on 05/25/2022 2:22:51 AM  Radiology DG Chest 2 View  Result Date: 05/25/2022 CLINICAL DATA:  Chest pain EXAM: CHEST - 2 VIEW COMPARISON:  07/21/2011 FINDINGS: Mild cardiomegaly. No focal pulmonary opacity. No pleural effusion or pneumothorax. No acute osseous abnormality. IMPRESSION: Mild cardiomegaly without additional acute cardiopulmonary process. Electronically Signed   By: Wiliam Ke M.D.   On: 05/25/2022 01:11     Procedures Procedures    Medications Ordered in ED Medications  potassium chloride SA (KLOR-CON M) CR tablet 20 mEq (20 mEq Oral Given 05/25/22 0242)    ED Course/ Medical Decision Making/ A&P                           Medical Decision Making Amount and/or Complexity of Data Reviewed Labs: ordered. Radiology: ordered.  Risk Prescription drug management.   Patient here for evaluation of sharp left upper chest pain.  She is scheduled to follow-up with cardiology for outpatient echo given chest x-ray with cardiomegaly.  EKG is without acute ischemic changes and troponins are negative x 2.  Current clinical picture is not consistent with ACS.  Doubt PE as she is PERC negative.  No evidence of pneumonia.  Presentation is not consistent with acute CHF.  Discussed with patient home care for chest pain.  Discussed outpatient follow-up and return precautions.  Chest x-ray is negative for acute abnormality, images personally reviewed and interpreted, agree with radiologist interpretation.        Final Clinical Impression(s) / ED Diagnoses Final diagnoses:  Nonspecific chest pain    Rx / DC Orders ED Discharge Orders     None         Tilden Fossa, MD 05/25/22 [phone]

## 2022-05-25 NOTE — ED Triage Notes (Signed)
Endorses CP that began earlier this evening. Pain is sharp and radiates to LT arm. Denies hx of MI; recent HTN dx with meds. Denies fever, N/V/D.
